# Patient Record
Sex: Male | Born: 1970 | Hispanic: No | Marital: Married | Smoking: Former smoker
Health system: Southern US, Community
[De-identification: ages and names within clinical notes are randomized; demographics above are authoritative.]

## PROBLEM LIST (undated history)

## (undated) DIAGNOSIS — K219 Gastro-esophageal reflux disease without esophagitis: Secondary | ICD-10-CM

## (undated) DIAGNOSIS — B353 Tinea pedis: Secondary | ICD-10-CM

## (undated) DIAGNOSIS — B181 Chronic viral hepatitis B without delta-agent: Secondary | ICD-10-CM

---

## 2008-09-30 ENCOUNTER — Encounter: Admission: RE | Admit: 2008-09-30 | Discharge: 2008-09-30 | Payer: Self-pay | Admitting: Gastroenterology

## 2010-04-21 ENCOUNTER — Emergency Department (HOSPITAL_BASED_OUTPATIENT_CLINIC_OR_DEPARTMENT_OTHER): Admission: EM | Admit: 2010-04-21 | Discharge: 2010-04-21 | Payer: Self-pay | Admitting: Emergency Medicine

## 2010-08-29 ENCOUNTER — Encounter: Payer: Self-pay | Admitting: Gastroenterology

## 2010-08-30 ENCOUNTER — Encounter: Payer: Self-pay | Admitting: Gastroenterology

## 2011-09-06 ENCOUNTER — Other Ambulatory Visit: Payer: Self-pay

## 2011-09-06 ENCOUNTER — Observation Stay (HOSPITAL_BASED_OUTPATIENT_CLINIC_OR_DEPARTMENT_OTHER)
Admission: EM | Admit: 2011-09-06 | Discharge: 2011-09-07 | Disposition: A | Discharge status: Home / Self Care | Payer: 59 | Attending: Internal Medicine | Admitting: Internal Medicine

## 2011-09-06 ENCOUNTER — Emergency Department (INDEPENDENT_AMBULATORY_CARE_PROVIDER_SITE_OTHER): Payer: 59

## 2011-09-06 ENCOUNTER — Encounter (HOSPITAL_BASED_OUTPATIENT_CLINIC_OR_DEPARTMENT_OTHER): Payer: Self-pay | Admitting: *Deleted

## 2011-09-06 DIAGNOSIS — R0789 Other chest pain: Principal | ICD-10-CM | POA: Insufficient documentation

## 2011-09-06 DIAGNOSIS — K219 Gastro-esophageal reflux disease without esophagitis: Secondary | ICD-10-CM | POA: Insufficient documentation

## 2011-09-06 DIAGNOSIS — I214 Non-ST elevation (NSTEMI) myocardial infarction: Secondary | ICD-10-CM

## 2011-09-06 DIAGNOSIS — M79609 Pain in unspecified limb: Secondary | ICD-10-CM

## 2011-09-06 DIAGNOSIS — R079 Chest pain, unspecified: Secondary | ICD-10-CM

## 2011-09-06 HISTORY — DX: Tinea pedis: B35.3

## 2011-09-06 HISTORY — DX: Gastro-esophageal reflux disease without esophagitis: K21.9

## 2011-09-06 HISTORY — DX: Chronic viral hepatitis B without delta-agent: B18.1

## 2011-09-06 LAB — CBC
Hemoglobin: 15.1 g/dL (ref 13.0–17.0)
Platelets: 233 10*3/uL (ref 150–400)
RBC: 5.33 MIL/uL (ref 4.22–5.81)
WBC: 8.3 10*3/uL (ref 4.0–10.5)

## 2011-09-06 LAB — BASIC METABOLIC PANEL
CO2: 28 mEq/L (ref 19–32)
Chloride: 102 mEq/L (ref 96–112)
Creatinine, Ser: 0.8 mg/dL (ref 0.50–1.35)
GFR calc Af Amer: >90 mL/min (ref >90)
Sodium: 139 mEq/L (ref 135–145)

## 2011-09-06 LAB — TROPONIN I: Troponin I: 0.31 ng/mL (ref <0.30)

## 2011-09-06 LAB — CARDIAC PANEL(CRET KIN+CKTOT+MB+TROPI)
CK, MB: 2 ng/mL (ref 0.3–4.0)
Relative Index: 1.7 (ref 0.0–2.5)

## 2011-09-06 MED ORDER — SODIUM CHLORIDE 0.9 % IJ SOLN
3.0000 mL | Freq: Two times a day (BID) | INTRAMUSCULAR | Status: DC
  Administered 2011-09-06: 3 mL via INTRAVENOUS

## 2011-09-06 MED ORDER — ACETAMINOPHEN 325 MG PO TABS: 650.0000 mg | ORAL_TABLET | Freq: Four times a day (QID) | ORAL | Status: DC | PRN

## 2011-09-06 MED ORDER — SODIUM CHLORIDE 0.9 % IJ SOLN
3.0000 mL | Freq: Two times a day (BID) | INTRAMUSCULAR | Status: DC
  Administered 2011-09-06: 3 mL via INTRAVENOUS
  Filled 2011-09-06: qty 3

## 2011-09-06 MED ORDER — SODIUM CHLORIDE 0.9 % IV SOLN
250.0000 mL | INTRAVENOUS | Status: DC | PRN
  Administered 2011-09-06: 250 mL via INTRAVENOUS

## 2011-09-06 MED ORDER — ALBUTEROL SULFATE (5 MG/ML) 0.5% IN NEBU: 2.5000 mg | INHALATION_SOLUTION | RESPIRATORY_TRACT | Status: DC | PRN

## 2011-09-06 MED ORDER — ASPIRIN EC 325 MG PO TBEC
325.0000 mg | DELAYED_RELEASE_TABLET | Freq: Every day | ORAL | Status: DC
  Administered 2011-09-07: 325 mg via ORAL
  Filled 2011-09-06: qty 1

## 2011-09-06 MED ORDER — NITROGLYCERIN 0.4 MG SL SUBL: 0.4000 mg | SUBLINGUAL_TABLET | SUBLINGUAL | Status: DC | PRN

## 2011-09-06 MED ORDER — THERA M PLUS PO TABS
1.0000 | ORAL_TABLET | Freq: Every day | ORAL | Status: DC
  Administered 2011-09-06 – 2011-09-07 (×2): 1 via ORAL
  Filled 2011-09-06 (×2): qty 1

## 2011-09-06 MED ORDER — ACETAMINOPHEN 650 MG RE SUPP: 650.0000 mg | Freq: Four times a day (QID) | RECTAL | Status: DC | PRN

## 2011-09-06 MED ORDER — SODIUM CHLORIDE 0.9 % IJ SOLN
3.0000 mL | INTRAMUSCULAR | Status: DC | PRN
  Filled 2011-09-06: qty 3

## 2011-09-06 MED ORDER — ASPIRIN 81 MG PO CHEW
324.0000 mg | CHEWABLE_TABLET | Freq: Once | ORAL | Status: AC
  Administered 2011-09-06: 324 mg via ORAL
  Filled 2011-09-06: qty 4

## 2011-09-06 MED ORDER — PANTOPRAZOLE SODIUM 40 MG PO TBEC
40.0000 mg | DELAYED_RELEASE_TABLET | Freq: Every day | ORAL | Status: DC
  Administered 2011-09-06: 40 mg via ORAL
  Filled 2011-09-06: qty 1

## 2011-09-06 NOTE — H&P (Signed)
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 He was sitting and watching TV when he had 3/10, pinching type of precordial chest pain with no associated dyspnea, diaphoresis, radiation. This pain did not worsen with deep inspiration and was not related to food. It resolved spontaneously. In the last 4-5 days patient has been stressed by the death of his father. His father passed away 4 days ago from metastatic gallbladder cancer. After finishing all the last rites, patient was resting last night  and at approximately 9 PM experienced precordial 3/10 pressure type of chest pain/discomfort with intermittent radiation to his left back, left axilla and left medial arm up to the elbow. This was not associated with dyspnea, palpitations, diaphoresis, deep inspiration. This was unlike the pain of his usual reflux. This pain lasted all night and he was worried about it. He called his primary care physician this morning who advised him to come to the emergency department. He went to the [facility] emergency department where his troponin was minimally elevated at 0.31. He was given a full dose of aspirin and was transferred to the [facility] for further evaluation and management. Patient still has intermittent left-sided chest pain. Does no history of recent travel or asymmetrical leg swelling or pain. He gives no history of lifting heavy weights or direct trauma.   Past Medical Hist did notory: Past Medical History  Diagnosis Date  . GERD (gastroesophageal reflux disease)   . Hepatitis B carrier   . Athlete's foot     bilateral    Past Surgical History: History reviewed. No pertinent past surgical history.  Allergies:  No Known Allergies  Medications: Prior to Admission  medications   Medication Sig Start Date End Date Taking? Authorizing Provider  aspirin 81 MG tablet Take 160 mg by mouth daily.   Yes Historical Provider, MD  Multiple Vitamins-Minerals (MULTIVITAMINS THER. W/MINERALS) TABS Take 1 tablet by mouth daily.   Yes Historical Provider, MD  naproxen sodium (ANAPROX) 220 MG tablet Take 220 mg by mouth daily as needed. For pain   Yes Historical Provider, MD    Family History: Family History  Problem Relation Age of Onset  . Hypertension Mother   . Coronary artery disease Father   . Cancer Father     Social History:  reports that he has quit smoking. He has never used smokeless tobacco. He reports that he does not drink alcohol or use illicit drugs.  Review of Systems:  All systems reviewed and apart from history of presenting illness is negative.   Physical Exam: Filed Vitals:   09/06/11 1240 09/06/11 1350 09/06/11 1502 09/06/11 1603  BP: 126/78 122/68 128/76 128/91  Pulse: 79 79 82 50  Temp: 98 F (36.7 C) 98.5 F (36.9 C) 98.6 F (37 C) 97.7 F (36.5 C)  TempSrc: Oral Oral  Oral  Resp: 18 20 20 18   Height:    5\' 9"  (1.753 m)  Weight:    95.8 kg (211 lb 3.2 oz)  SpO2: 98% 97% 100% 94%   General exam: Well built and nourished male patient who is in no obvious distress. Head, eyes and ENT: Nontraumatic and normocephalic. Pupils equally reacting to light and accommodation. Oral mucosa is moist without any acute findings. Lymphatics: No  lymphadenopathy. Neck: Supple. No JVD or carotid bruit. Respiratory system: Clear. No increased work of breathing. No chest wall tenderness. Cardiovascular system: First and second heart sounds heard, regular. No JVD or carotid bruit. Telemetry shows sinus rhythm in the 80s. Gastrointestinal system: Abdomen is nondistended, soft and normal bowel sounds heard. No organomegaly or masses appreciated. Nontender. Central nervous system: Alert and oriented. No focal neurological deficits. Extremities: 5 x 5  symmetric power in all limbs and no asymmetric swelling. Patient has scars of healing athletes feet. Skin without any or rashes   Labs on Admission:   Basename 09/06/11 1120  NA 139  K 3.8  CL 102  CO2 28  GLUCOSE 95  BUN 17  CREATININE 0.80  CALCIUM 9.7  MG --  PHOS --   No results found for this basename: AST:2,ALT:2,ALKPHOS:2,BILITOT:2,PROT:2,ALBUMIN:2 in the last 72 hours No results found for this basename: LIPASE:2,AMYLASE:2 in the last 72 hours  Basename 09/06/11 1120  WBC 8.3  NEUTROABS --  HGB 15.1  HCT 43.0  MCV 80.7  PLT 233    Basename 09/06/11 1120  CKTOTAL --  CKMB --  CKMBINDEX --  TROPONINI 0.31*   No results found for this basename: TSH,T4TOTAL,FREET3,T3FREE,THYROIDAB in the last 72 hours No results found for this basename: VITAMINB12:2,FOLATE:2,FERRITIN:2,TIBC:2,IRON:2,RETICCTPCT:2 in the last 72 hours  Radiological Exams on Admission: Dg Chest 2 View  09/06/2011  CHEST - 2 VIEW  IMPRESSION: Normal chest.    EKG: Shows normal sinus rhythm at 84 beats per minute, normal axis, normal EKG.      Assessment/Plan 1. Chest pain: Recurrent chest pain x2 in the last 6 weeks. Mostly atypical but some typical features for angina. Risk factors for coronary artery disease include family history but at a much older age. EKG is normal. Unclear significance of single minimally elevated troponin without CK or CK total readings. Admit to telemetry for 23 hour observation. Cycle cardiac enzymes. Continue aspirin 325 mg and Protonix. When necessary sublingual nitroglycerin. [facility] has been consulted to see if patient needs any further ischemia evaluation such as a stress test. Requested 2-D echocardiogram. 2. History of gastroesophageal reflux disease: Proton pump inhibitors 3. History of hepatitis B carrier state 4. History of athletes feet     Giacomo Valone 09/06/2011, 5:19 PM

## 2011-09-06 NOTE — ED Provider Notes (Addendum)
History     CSN: [id number]  Arrival date & time 09/06/11  1112   First MD Initiated Contact with Patient 09/06/11 1120      No chief complaint on file.   (Consider location/radiation/quality/duration/timing/severity/associated sxs/prior treatment) Patient is a 41 y.o. male presenting with chest pain. The history is provided by the patient.  Chest Pain The chest pain began 12 - 24 hours ago. Chest pain occurs constantly. The chest pain is improving. Associated with: not associated with exertion, breathing, or any other factor. At its most intense, the pain is at 3/10. The pain is currently at 0/10. The severity of the pain is mild. The quality of the pain is described as aching and dull. The pain radiates to the upper back and left arm. Exacerbated by: No worsening factors, including exertion or deep respirations. Pertinent negatives for primary symptoms include no fever, no fatigue, no syncope, no shortness of breath, no cough, no wheezing, no palpitations, no abdominal pain, no nausea, no vomiting, no dizziness and no altered mental status.  Pertinent negatives for associated symptoms include no claudication, no diaphoresis, no lower extremity edema, no near-syncope, no numbness, no orthopnea, no paroxysmal nocturnal dyspnea and no weakness. He tried nothing for the symptoms. Risk factors include no known risk factors. Past medical history comments: Department hepatitis B, no other medical history including diabetes, hypertension, or hyperlipidemia, and the patient is a nonsmoker Family history comments: No early coronary artery disease in family     History reviewed. No pertinent past medical history.  History reviewed. No pertinent past surgical history.  History reviewed. No pertinent family history.  History  Substance Use Topics  . Smoking status: Former Games developer  . Smokeless tobacco: Not on file  . Alcohol Use: No      Review of Systems  Constitutional: Negative for fever,  chills, diaphoresis, activity change, appetite change and fatigue.  HENT: Negative for ear pain, congestion, sore throat, rhinorrhea, neck pain, postnasal drip and sinus pressure.   Eyes: Negative for visual disturbance.  Respiratory: Negative for cough, chest tightness, shortness of breath and wheezing.   Cardiovascular: Positive for chest pain. Negative for palpitations, orthopnea, claudication, leg swelling, syncope and near-syncope.  Gastrointestinal: Negative for nausea, vomiting, abdominal pain and diarrhea.  Genitourinary: Negative.   Musculoskeletal: Negative for myalgias and back pain.  Skin: Negative for color change, pallor and rash.  Neurological: Negative for dizziness, tremors, syncope, weakness, light-headedness, numbness and headaches.  Hematological: Does not bruise/bleed easily.  Psychiatric/Behavioral: Negative for confusion and altered mental status.    Allergies  Review of patient's allergies indicates no known allergies.  Home Medications  No current outpatient prescriptions on file.  BP 126/91  Pulse 78  Temp(Src) 98.6 F (37 C) (Oral)  Resp 18  SpO2 96%  Physical Exam  Nursing note and vitals reviewed. Constitutional: He is oriented to person, place, and time. He appears well-developed and well-nourished. No distress.  HENT:  Head: Normocephalic and atraumatic.  Mouth/Throat: Oropharynx is clear and moist.  Eyes: EOM are normal. Pupils are equal, round, and reactive to light.  Neck: Normal range of motion. Neck supple. No JVD present. No tracheal deviation present.  Cardiovascular: Normal rate, regular rhythm, S1 normal, S2 normal, normal heart sounds and intact distal pulses.   No extrasystoles are present. PMI is not displaced.  Exam reveals no gallop and no friction rub.   No murmur heard. Pulmonary/Chest: Effort normal and breath sounds normal. No accessory muscle usage or stridor. Not tachypneic.  No respiratory distress. He has no decreased breath  sounds. He has no wheezes. He has no rhonchi. He has no rales. He exhibits no tenderness, no bony tenderness, no crepitus and no retraction.  Abdominal: Soft. Bowel sounds are normal. He exhibits no distension and no mass. There is no tenderness. There is no rebound and no guarding.  Musculoskeletal: Normal range of motion. He exhibits no edema and no tenderness.  Neurological: He is alert and oriented to person, place, and time. No cranial nerve deficit. He exhibits normal muscle tone.  Skin: Skin is warm and dry. No rash noted. He is not diaphoretic. No erythema. No pallor.  Psychiatric: He has a normal mood and affect. His behavior is normal. Judgment and thought content normal.    ED Course  Procedures (including critical care time)  Date: 09/06/2011  Rate: 84  Rhythm: normal sinus rhythm  QRS Axis: normal  Intervals: normal  ST/T Wave abnormalities: normal  Conduction Disutrbances:none  Narrative Interpretation: Non-provocative EKG  Old EKG Reviewed: none available    Labs Reviewed  CBC  I-STAT, CHEM 8  I-STAT TROPONIN I   No results found.   No diagnosis found.  1:43 PM The patient is in no distress and reports no chest pain currently.  His troponin is slightly elevated and as such I will seek admission to the hospital for further evaluation and testing. The patient states his understanding of and agreement with the plan of care.  MDM  Musculoskeletal chest pain, costochondritis, GERD, Gastrointestinal Chest Pain, Pleuritic Chest Pain, Pneumonia, Pneumothorax, Pulmonary Embolism, Esophageal Spasm, Arrhythmia considered among other potential etiologies in the patient's differential diagnosis.  ACS or AMI are thought to be unlikely given the patient's lack of risk factors, normal ecg, normal physical examination, and his atypical features of chest pain.  These diagnoses will be investigated nonetheless.          Felisa Bonier, MD 09/06/11 1204  Felisa Bonier, MD 09/06/11 1344

## 2011-09-06 NOTE — ED Notes (Signed)
Via [facility]--[facility]--unassign cardiology--spoke with Brett Sharp

## 2011-09-06 NOTE — Consult Note (Addendum)
CARDIOLOGY CONSULT NOTE   Patient ID: Brett Sharp MRN: [id number] DOB/AGE: May 28, 1971 41 y.o.  Admit date: 09/06/2011  Primary Physician   Fredderick Severance, MD, MD Primary Cardiologist  none Reason for Consultation   Chest pain   Brett Sharp is a 41 y.o. male with no history of CAD.   He is admitted today through med center [facility] with chest pain for the past 24-30 hours.  The chest discomfort is described as a very slight pinching sensation. It tends to radiate out his left arm. It is rated as a 1-2 on a scale of 10. It is not severe. There is no pleuritic component. It does not get worse with change of position, eating, drinking, taking a deep breath, or exercise. He is fairly active and the pain has not increased with activity over the past day. He has not done any rigorous exercise to have caused this pain.  His initial troponin level at [facility] was his 0.31 with a normal being up to 0.30. Subsequent cardiac enzymes are negative.   Past Medical History  Diagnosis Date  . GERD (gastroesophageal reflux disease)   . Hepatitis B carrier   . Athlete's foot     bilateral     History reviewed. No pertinent past surgical history.  No Known Allergies  I have reviewed the patient's current medications. Prescriptions prior to admission  Medication Sig Dispense Refill  . aspirin 81 MG tablet Take 160 mg by mouth daily.      . Multiple Vitamins-Minerals (MULTIVITAMINS THER. W/MINERALS) TABS Take 1 tablet by mouth daily.      . naproxen sodium (ANAPROX) 220 MG tablet Take 220 mg by mouth daily as needed. For pain          No current facility-administered medications on file prior to encounter.   No current outpatient prescriptions on file prior to encounter.    History   Social History  . Marital Status: Married    Spouse Name: N/A    Number of Children: N/A  . Years of Education: N/A   He works in Conservation officer, historic buildings.   Social History Main Topics    . Smoking status: Former Games developer  . Smokeless tobacco: Never Used  . Alcohol Use: No  . Drug Use: No  . Sexually Active: Not Currently   Other Topics Concern  . Not on file   Social History Narrative  . No narrative on file     Family History  Problem Relation Age of Onset  . Hypertension Mother   . Coronary artery disease Father   . Cancer Father      ROS:  Full 14 point review of systems complete and found to be negative unless listed  above  Physical Exam: Blood pressure 128/91, pulse 50, temperature 97.7 F (36.5 C), temperature source Oral, resp. rate 18, height 5\' 9"  (1.753 m), weight 211 lb 3.2 oz (95.8 kg), SpO2 94.00%.   General: Well developed, well nourished, in no acute distress Head: Eyes PERRLA, No xanthomas.   Normocephalic and atraumatic, oropharynx without edema or exudate. Dentition Lungs: Clear bilaterally to auscultation  Heart: HRRR S1 S2, no rub/gallop, Heart irregular rate and rhythm with S1, S2  murmur. pulses are 2+ & equal all 4 extrem.   Neck: No carotid bruit. No lymphadenopathy.  JVD. Abdomen: Bowel sounds present, abdomen soft and non-tender without masses or hernias noted. Msk:  No spine or cva tenderness. Normal strength and tone for age, no  joint deformities or effusions. Extremities: No clubbing or cyanosis.  edema.  Neuro: Alert and oriented X 3. No focal deficits noted. Psych:  Good affect, responds appropriately Skin: No rashes or lesions noted.  Labs:   Lab Results  Component Value Date   WBC 8.3 09/06/2011   HGB 15.1 09/06/2011   HCT 43.0 09/06/2011   MCV 80.7 09/06/2011   PLT 233 09/06/2011   No results found for this basename: INR in the last 72 hours  Lab 09/06/11 1120  NA 139  K 3.8  CL 102  CO2 28  BUN 17  CREATININE 0.80  CALCIUM 9.7  PROT --  BILITOT --  ALKPHOS --  ALT --  AST --  GLUCOSE 95    Basename 09/06/11 1825 09/06/11 1120  CKTOTAL 118 --  CKMB 2.0 --  TROPONINI <0.30 0.31*   ECG:06-Sep-2011  11:21:27 [facility]-HPED ROUTINE RECORD Normal sinus rhythm, Rate 84 Normal ECG No previous ECGs available  Radiology:  Dg Chest 2 View  09/06/2011  *RADIOLOGY REPORT*  Clinical Data: Chest pain and left arm pain.  CHEST - 2 VIEW  Comparison: None.  Findings: The heart size and vascularity are normal and the lungs are clear.  No osseous abnormality.  IMPRESSION: Normal chest.  Original Report Authenticated By: Gwynn Burly, M.D.    ASSESSMENT AND PLAN:    1. Chest pain: The patient presents with what appears to be noncardiac chest pain. The symptoms are very atypical. He's had symptoms now for the past 30 hours and despite this his cardiac enzymes are negative. I'd do not think that the troponin level of 0.31 is of any significance-especially since subsequent troponin levels are negative. That level is certainly within the margin of error for a lab test.   His EKG is completely normal.  If the remaining cardiac enzymes are negative then he can be discharged to home. I've given him my business card and will call to arrange a stress test as an outpatient.  I do not think that he necessarily needs an echocardiogram at this point. I certainly would not delay his discharge from the hospital for an echocardiogram if his cardiac enzymes and EKG are negative.   The patient has non-ST segment elevation myocardial infarction listed in his problem list. If the cardiac enzymes remain negative then I certainly would deleted that from his problem list.  As of this point, I would not diagnose him with a non-ST segment elevation myocardial infarction.   Vesta Mixer, Montez Hageman., MD, [facility] 09/06/2011, 8:25 PM

## 2011-09-07 ENCOUNTER — Encounter (HOSPITAL_COMMUNITY): Payer: Self-pay | Admitting: Dietician

## 2011-09-07 ENCOUNTER — Other Ambulatory Visit: Payer: Self-pay

## 2011-09-07 LAB — CARDIAC PANEL(CRET KIN+CKTOT+MB+TROPI)
CK, MB: 1.7 ng/mL (ref 0.3–4.0)
Relative Index: 1.7 (ref 0.0–2.5)
Total CK: 103 U/L (ref 7–232)
Total CK: 103 U/L (ref 7–232)

## 2011-09-07 NOTE — Progress Notes (Signed)
Pt quit smoking over 12 months ago.

## 2011-09-07 NOTE — Progress Notes (Signed)
CARDIOLOGY CONSULT NOTE   Patient ID: Brett Sharp MRN: [id number] DOB/AGE: 43/11/1970 43 y.o.  Admit date: 09/06/2011  Primary Physician   Fredderick Severance, MD, MD Primary Cardiologist  none Reason for Consultation   Chest pain   Brett Sharp is a 41 y.o. male with no history of CAD.   He is admitted today through med center [facility] with chest pain for the past 24-30 hours.  The chest discomfort is described as a very slight pinching sensation. It tends to radiate out his left arm. It is rated as a 1-2 on a scale of 10. It is not severe. There is no pleuritic component. It does not get worse with change of position, eating, drinking, taking a deep breath, or exercise. He is fairly active and the pain has not increased with activity over the past day. He has not done any rigorous exercise to have caused this pain.  His initial troponin level at [facility] was his 0.31 with a normal being up to 0.30. Subsequent cardiac enzymes are negative.   Past Medical History  Diagnosis Date  . GERD (gastroesophageal reflux disease)   . Hepatitis B carrier   . Athlete's foot     bilateral     History reviewed. No pertinent past surgical history.  No Known Allergies  I have reviewed the patient's current medications. Prescriptions prior to admission  Medication Sig Dispense Refill  . aspirin 81 MG tablet Take 160 mg by mouth daily.      . Multiple Vitamins-Minerals (MULTIVITAMINS THER. W/MINERALS) TABS Take 1 tablet by mouth daily.      . naproxen sodium (ANAPROX) 220 MG tablet Take 220 mg by mouth daily as needed. For pain          No current facility-administered medications on file prior to encounter.   No current outpatient prescriptions on file prior to encounter.    History   Social History  . Marital Status: Married    Spouse Name: N/A    Number of Children: N/A  . Years of Education: N/A   He works in Conservation officer, historic buildings.   Social History Main Topics    . Smoking status: Former Games developer  . Smokeless tobacco: Never Used  . Alcohol Use: No  . Drug Use: No  . Sexually Active: Not Currently   Other Topics Concern  . Not on file   Social History Narrative  . No narrative on file     Family History  Problem Relation Age of Onset  . Hypertension Mother   . Coronary artery disease Father   . Cancer Father      ROS:  Full 14 point review of systems complete and found to be negative unless listed  above  Physical Exam: Blood pressure 106/77, pulse 77, temperature 97.7 F (36.5 C), temperature source Oral, resp. rate 18, height 5\' 9"  (1.753 m), weight 211 lb 3.2 oz (95.8 kg), SpO2 97.00%.   General: Well developed, well nourished, in no acute distress Head: Eyes PERRLA, No xanthomas.   Normocephalic and atraumatic, oropharynx without edema or exudate. Dentition Lungs: Clear bilaterally to auscultation  Heart: HRRR S1 S2, no rub/gallop, Heart irregular rate and rhythm with S1, S2  murmur. pulses are 2+ & equal all 4 extrem.   Neck: No carotid bruit. No lymphadenopathy.  JVD. Abdomen: Bowel sounds present, abdomen soft and non-tender without masses or hernias noted. Msk:  No spine or cva tenderness. Normal strength and tone for age, no  joint deformities or effusions. Extremities: No clubbing or cyanosis.  edema.  Neuro: Alert and oriented X 3. No focal deficits noted. Psych:  Good affect, responds appropriately Skin: No rashes or lesions noted.  Labs:   Lab Results  Component Value Date   WBC 8.3 09/06/2011   HGB 15.1 09/06/2011   HCT 43.0 09/06/2011   MCV 80.7 09/06/2011   PLT 233 09/06/2011   No results found for this basename: INR in the last 72 hours   Lab 09/06/11 1120  NA 139  K 3.8  CL 102  CO2 28  BUN 17  CREATININE 0.80  CALCIUM 9.7  PROT --  BILITOT --  ALKPHOS --  ALT --  AST --  GLUCOSE 95    Basename 09/07/11 0100 09/06/11 1825 09/06/11 1120  CKTOTAL 103 118 --  CKMB 1.7 2.0 --  TROPONINI <0.30 <0.30  0.31*   ECG:06-Sep-2011 11:21:27 [facility]-HPED ROUTINE RECORD Normal sinus rhythm, Rate 84 Normal ECG No previous ECGs available  Radiology:  Dg Chest 2 View  09/06/2011  *RADIOLOGY REPORT*  Clinical Data: Chest pain and left arm pain.  CHEST - 2 VIEW  Comparison: None.  Findings: The heart size and vascularity are normal and the lungs are clear.  No osseous abnormality.  IMPRESSION: Normal chest.  Original Report Authenticated By: Gwynn Burly, M.D.    ASSESSMENT AND PLAN:    1. Chest pain: The patient presents with what appears to be noncardiac chest pain. The symptoms are very atypical. He's had symptoms now for the past 30 hours and despite this his cardiac enzymes are negative. I'd do not think that the troponin level of 0.31 is of any significance-especially since subsequent troponin levels are negative. That level is certainly within the margin of error for a lab test.   His EKG is completely normal.  The patient is feeling better .  I will see him in the office for a treadmill in a week or so, - OK to go home   Vesta Mixer, Montez Hageman., MD, [facility] 09/07/2011, 9:03 AM

## 2011-09-07 NOTE — Progress Notes (Signed)
Utilization review complete 

## 2011-09-07 NOTE — Discharge Summary (Signed)
Discharge Summary  Brett Sharp MR#: [id number]  DOB:[birth year]  Date of Admission: 09/06/2011 Date of Discharge: 09/07/2011  Patient's PCP: Fredderick Severance, MD, MD  Attending Physician:Emani Morad  Consults: Cardiology: Leodis Sias, M.D  Discharge Diagnoses: 1. Atypical Chest pain 2. 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 Pain was 3/10 in severity, pinching in nature with some radiation to the left side of the back, left axilla and left upper arm. He did not have any other associated symptoms. He went to the [facility] emergency department in [place]. EKG was normal. Troponin x1 was 0.31. Patient was transferred to [facility] to rule out acute coronary syndrome.   Discharge Medications Current Discharge Medication List    CONTINUE these medications which have NOT CHANGED   Details  aspirin EC 81 MG tablet Take 81 mg by mouth daily.    Multiple Vitamins-Minerals (MULTIVITAMINS THER. W/MINERALS) TABS Take 1 tablet by mouth daily.    naproxen sodium (ANAPROX) 220 MG tablet Take 220 mg by mouth daily as needed. For pain        Hospital Course: 1. Chest pain: Resolved for greater than 12 hours. No acute changes on EKG. Cardiac enzymes cycled x3 and negative. Cardiology was consulted and Dr. Elease Hashimoto opined that this was most likely noncardiac chest pain and has cleared him for discharge to followup with him as an outpatient for a treadmill stress test in a week or so. Patient is to continue his daily aspirin 81 mg. 2. 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 Sharrell Ku. If his stress test is negative and patient continues to have chest pains then may consider evaluation for upper GI causes with an EGD. 3. GERD: Clinical diagnosis. Patient has never had an EGD. Most times he does not have any symptoms  and is not on any scheduled medications.     Day of Discharge BP 106/77  Pulse 77  Temp(Src) 97.7 F (36.5 C) (Oral)  Resp 18  Ht 5\' 9"  (1.753 m)  Wt 95.8 kg (211 lb 3.2 oz)  BMI 31.19 kg/m2  SpO2 97%  General exam: Comfortable Respiratory system: Clear. No increased work of breathing. Cardiovascular system: First and second heart sounds heard, regular. No JVD or murmurs. Telemetry shows sinus rhythm in 60s to 70s with occasional missed beat and PACs. Gastrointestinal system: Abdomen is nondistended, nontender, soft and normal bowel sounds heard. Central nervous system: Alert and oriented. No focal neurological deficits.   Results for orders placed during the hospital encounter of 09/06/11 (from the past 48 hour(s))  CBC     Status: Normal   Collection Time   09/06/11 11:20 AM      Component Value Range Comment   WBC 8.3  4.0 - 10.5 (K/uL)    RBC 5.33  4.22 - 5.81 (MIL/uL)    Hemoglobin 15.1  13.0 - 17.0 (g/dL)    HCT 40.9  81.1 - 91.4 (%)    MCV 80.7  78.0 - 100.0 (fL)    MCH 28.3  26.0 - 34.0 (pg)    MCHC 35.1  30.0 - 36.0 (g/dL)    RDW 78.2  95.6 - 21.3 (%)    Platelets 233  150 - 400 (K/uL)   TROPONIN I     Status: Abnormal   Collection Time   09/06/11 11:20 AM  Component Value Range Comment   Troponin I 0.31 (*) <0.30 (ng/mL)   BASIC METABOLIC PANEL     Status: Normal   Collection Time   09/06/11 11:20 AM      Component Value Range Comment   Sodium 139  135 - 145 (mEq/L)    Potassium 3.8  3.5 - 5.1 (mEq/L)    Chloride 102  96 - 112 (mEq/L)    CO2 28  19 - 32 (mEq/L)    Glucose, Bld 95  70 - 99 (mg/dL)    BUN 17  6 - 23 (mg/dL)    Creatinine, Ser 4.09  0.50 - 1.35 (mg/dL)    Calcium 9.7  8.4 - 10.5 (mg/dL)    GFR calc non Af Amer >90  >90 (mL/min)    GFR calc Af Amer >90  >90 (mL/min)   CARDIAC PANEL(CRET KIN+CKTOT+MB+TROPI)     Status: Normal   Collection Time   09/06/11  6:25 PM      Component Value Range Comment   Total CK 118  7 - 232 (U/L)    CK,  MB 2.0  0.3 - 4.0 (ng/mL)    Troponin I <0.30  <0.30 (ng/mL)    Relative Index 1.7  0.0 - 2.5    CARDIAC PANEL(CRET KIN+CKTOT+MB+TROPI)     Status: Normal   Collection Time   09/07/11  1:00 AM      Component Value Range Comment   Total CK 103  7 - 232 (U/L)    CK, MB 1.7  0.3 - 4.0 (ng/mL)    Troponin I <0.30  <0.30 (ng/mL)    Relative Index 1.7  0.0 - 2.5    CARDIAC PANEL(CRET KIN+CKTOT+MB+TROPI)     Status: Normal   Collection Time   09/07/11  8:50 AM      Component Value Range Comment   Total CK 103  7 - 232 (U/L)    CK, MB 1.8  0.3 - 4.0 (ng/mL)    Troponin I <0.30  <0.30 (ng/mL)    Relative Index 1.7  0.0 - 2.5      Dg Chest 2 View  09/06/2011  *RADIOLOGY REPORT*  Clinical Data: Chest pain and left arm pain.  CHEST - 2 VIEW  Comparison: None.  Findings: The heart size and vascularity are normal and the lungs are clear.  No osseous abnormality.  IMPRESSION: Normal chest.  Original Report Authenticated By: Gwynn Burly, M.D.   EKG: Normal sinus rhythm. Voltage criteria for left ventricular hypertrophy versus normal varient. Q waves in lead 3 and aVF. T-wave inversion in leads 3 and aVF question repolarization abnormality.   Disposition: Discharge home in stable condition.  Diet: Heart healthy diet.  Activity: As tolerated.   Follow-up Appts: Discharge Orders    Future Orders Please Complete By Expires   Diet - low sodium heart healthy      Activity as tolerated - No restrictions      Call MD for:  severe uncontrolled pain         TESTS THAT NEED FOLLOW-UP Outpatient stress test with the cardiologist.   Time spent on discharge, talking to the patient, and coordinating care: 30 mins.   SignedMarcellus Scott, MD 09/07/2011, 11:19 AM

## 2011-09-08 ENCOUNTER — Telehealth: Payer: Self-pay | Admitting: *Deleted

## 2011-09-08 NOTE — Telephone Encounter (Signed)
Informed pt to expect a call from office with date and time of stress treadmill test. DR NAHSER WANTS TO BE THERE FOR TEST.

## 2011-09-14 ENCOUNTER — Other Ambulatory Visit: Payer: Self-pay | Admitting: Gastroenterology

## 2011-09-14 DIAGNOSIS — B181 Chronic viral hepatitis B without delta-agent: Secondary | ICD-10-CM

## 2011-09-16 ENCOUNTER — Ambulatory Visit (INDEPENDENT_AMBULATORY_CARE_PROVIDER_SITE_OTHER): Payer: Self-pay | Admitting: Cardiovascular Disease

## 2011-09-16 DIAGNOSIS — R079 Chest pain, unspecified: Secondary | ICD-10-CM

## 2011-09-16 NOTE — Progress Notes (Signed)
Exercise Treadmill Test  Pre-Exercise Testing Evaluation Rhythm: normal sinus  Rate: 76   PR:  .14 QRS:  .08  QT:  .36 QTc: .40     Test  Exercise Tolerance Test Ordering MD: Kristeen Miss MD  Interpreting MD:  Kristeen Miss, MD  Unique Test No: Kristeen Miss MD  Treadmill:  1  Indication for ETT: chest pain - rule out ischemia  Contraindication to ETT: No   Stress Modality: exercise - treadmill  Cardiac Imaging Performed: non   Protocol: standard Bruce - maximal  Max BP:  170/79  Max MPHR (bpm):  178 85% MPR (bpm):  151  MPHR obtained (bpm):  176 % MPHR obtained:  98  Reached 85% MPHR (min:sec):  4:26 Total Exercise Time (min-sec):  9:00  Workload in METS:  10.4 Borg Scale: 19  Reason ETT Terminated:  patient's desire to stop    ST Segment Analysis At Rest: normal ST segments - no evidence of significant ST depression With Exercise: no evidence of significant ST depression  Other Information Arrhythmia:  No Angina during ETT:  absent (0) Quality of ETT:  diagnostic  ETT Interpretation:  normal - no evidence of ischemia by ST analysis Non cardiac chest pain Comments: Pt did well  Recommendations: Continue with normal exercise. Good exercise capacity.

## 2011-09-22 ENCOUNTER — Ambulatory Visit
Admission: RE | Admit: 2011-09-22 | Discharge: 2011-09-22 | Disposition: A | Discharge status: Home / Self Care | Payer: 59 | Source: Ambulatory Visit | Attending: Gastroenterology | Admitting: Gastroenterology

## 2011-09-22 DIAGNOSIS — B181 Chronic viral hepatitis B without delta-agent: Secondary | ICD-10-CM

## 2012-06-03 IMAGING — CR DG CHEST 2V
2 series · 2 of 2 positions shown · non-contrast
Comparison: None.

CLINICAL DATA: Chest pain and left arm pain.

CHEST - 2 VIEW

[w chest pa]
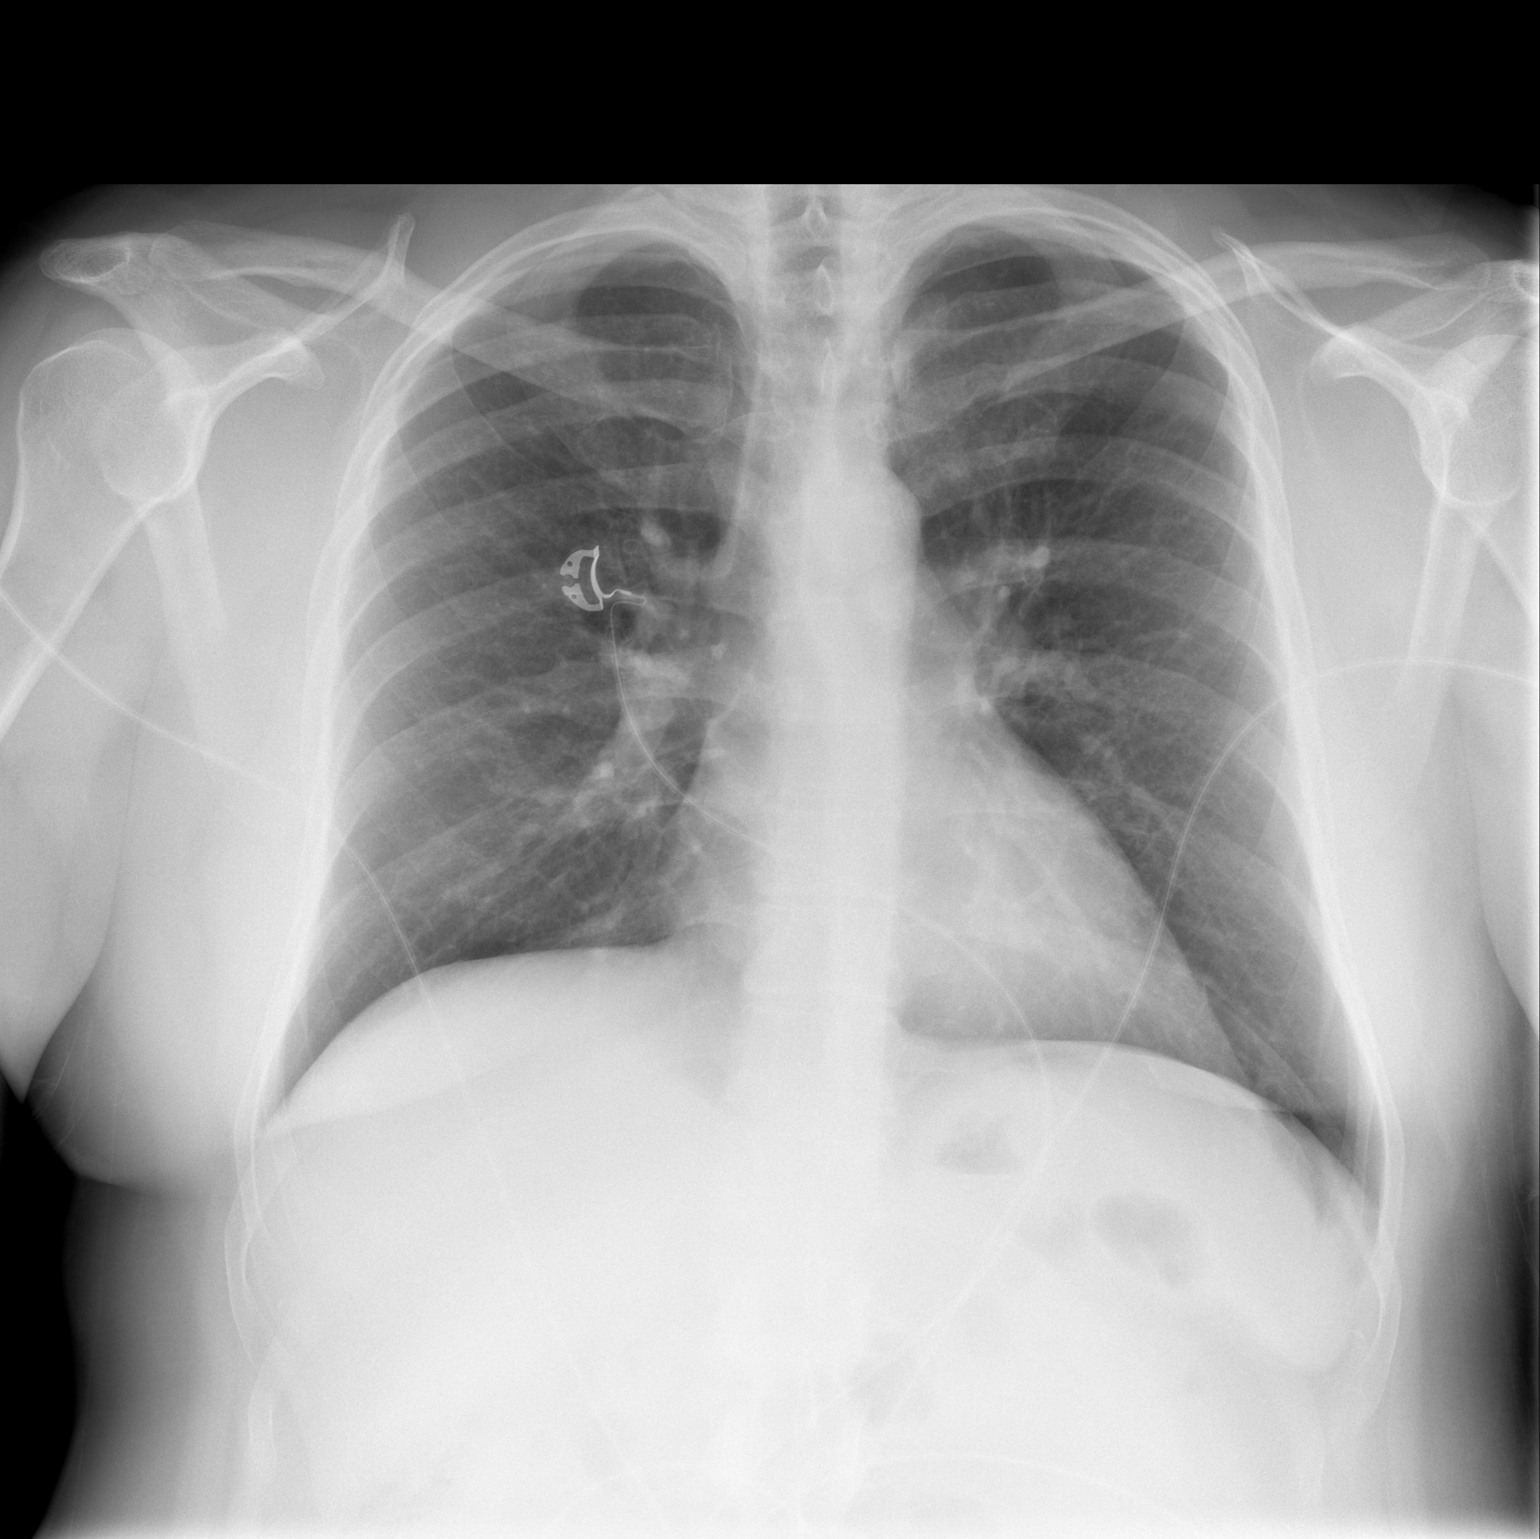

[w chest lat]
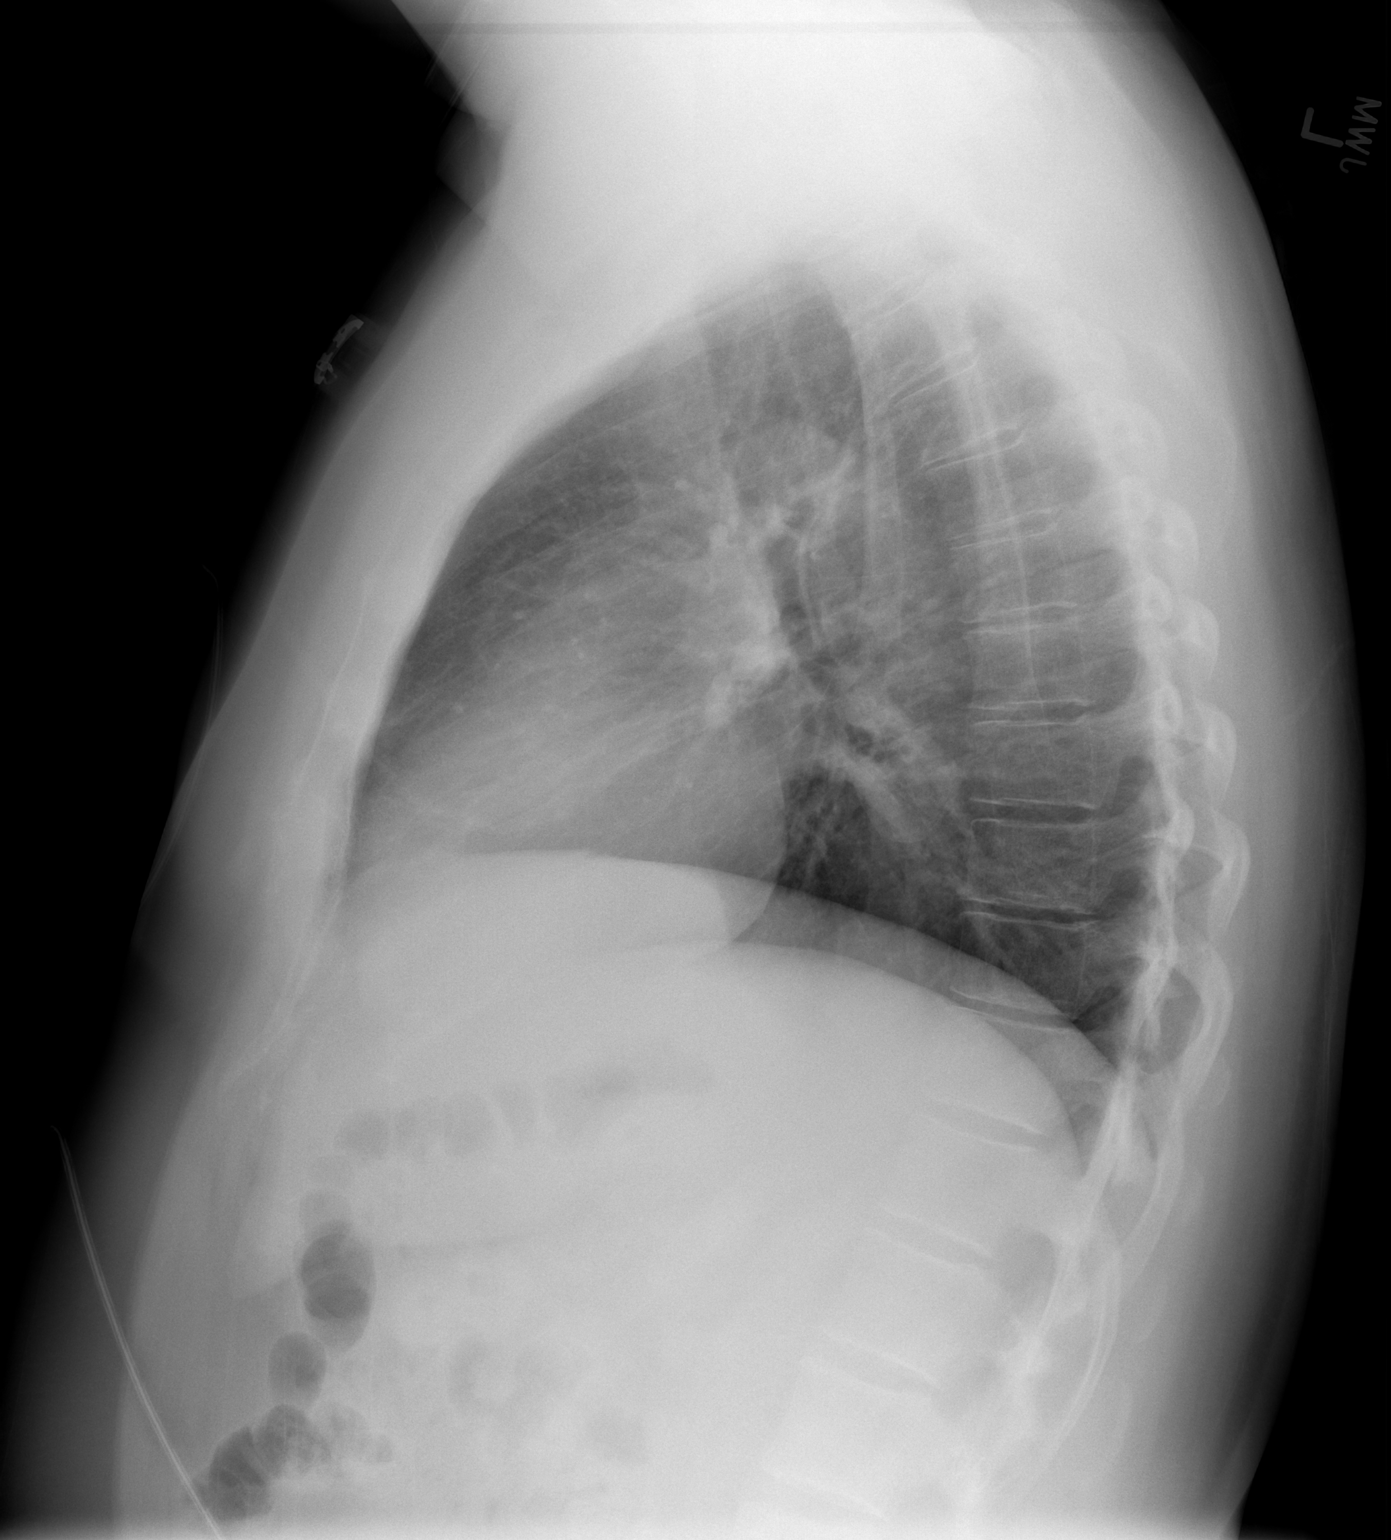

[2 of 2 positions shown; findings below may reference images not displayed]

FINDINGS: The heart size and vascularity are normal and the lungs
are clear.  No osseous abnormality.
IMPRESSION: Normal chest.

## 2012-06-19 IMAGING — US US ABDOMEN COMPLETE
1 series · 13 of 25 positions shown · non-contrast
Comparison: Abdominal ultrasound 09/30/2008 and 07/12/2007.

CLINICAL DATA: History of hepatitis B.

COMPLETE ABDOMINAL ULTRASOUND

[Series 1: us abdomen complete · 0.34mm/px · 13 of 84 slices shown]
[im 1/84]
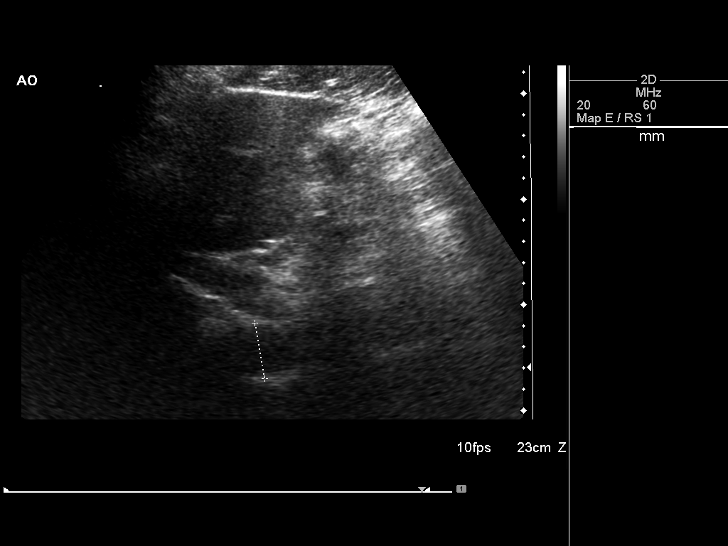
[im 7/84]
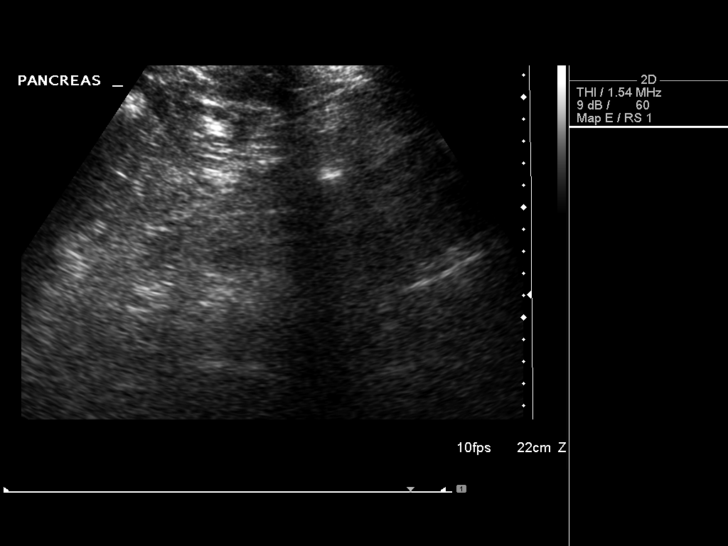
[im 14/84]
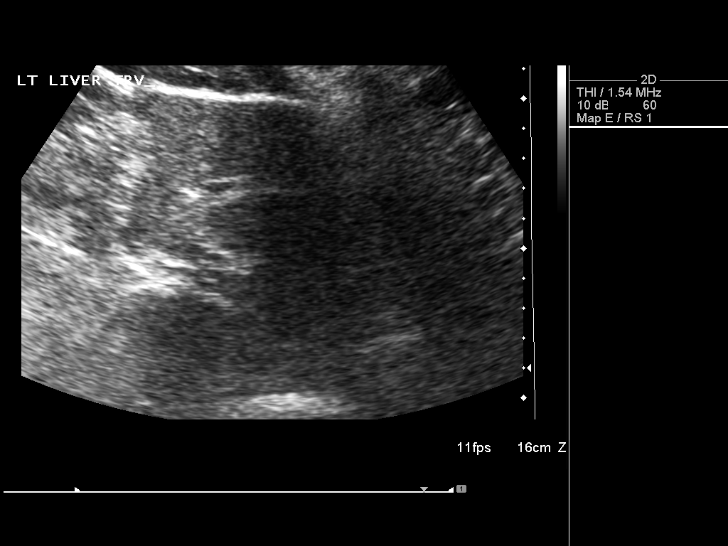
[im 21/84]
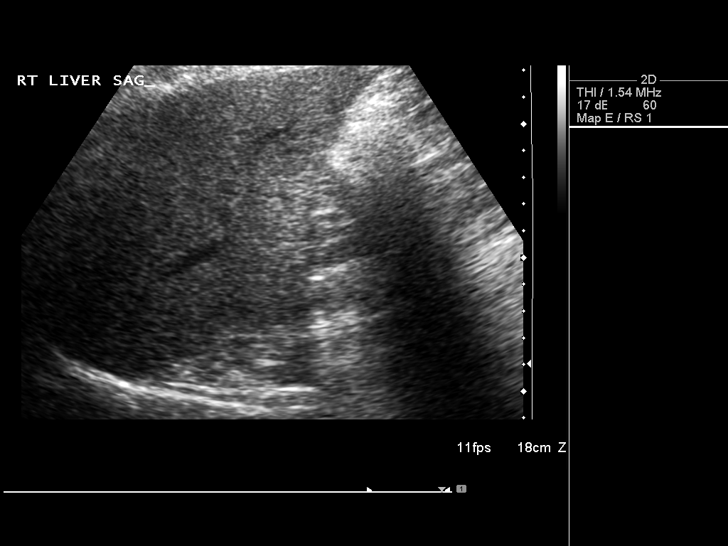
[im 28/84]
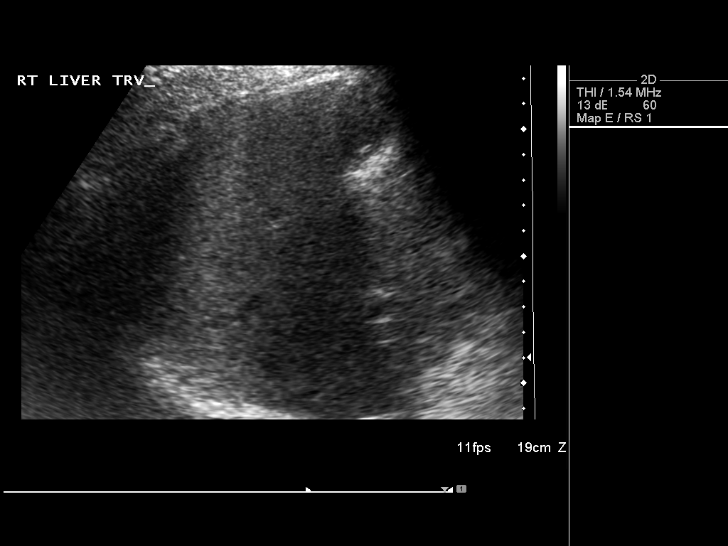
[im 35/84]
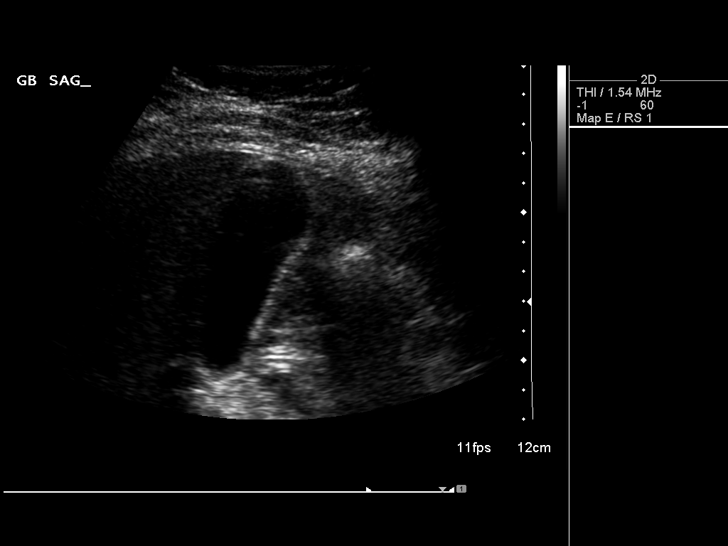
[im 42/84]
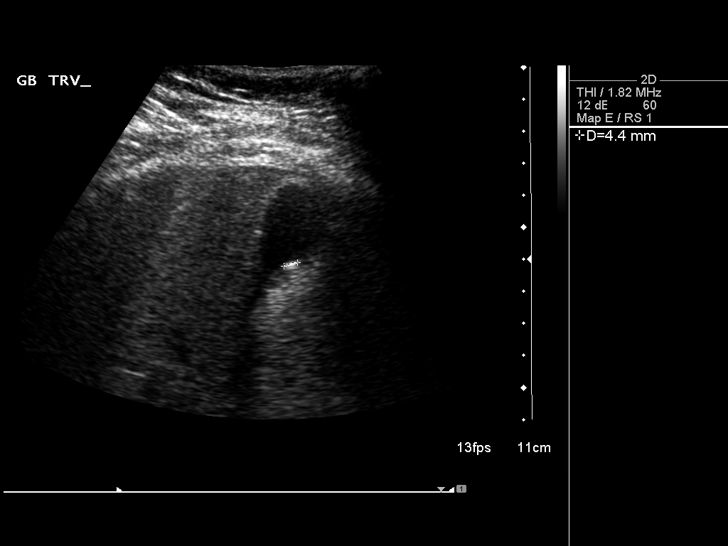
[im 49/84]
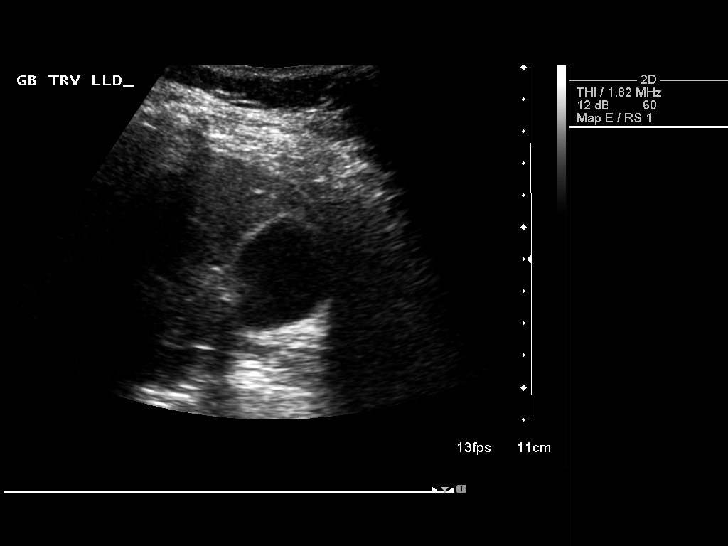
[im 56/84]
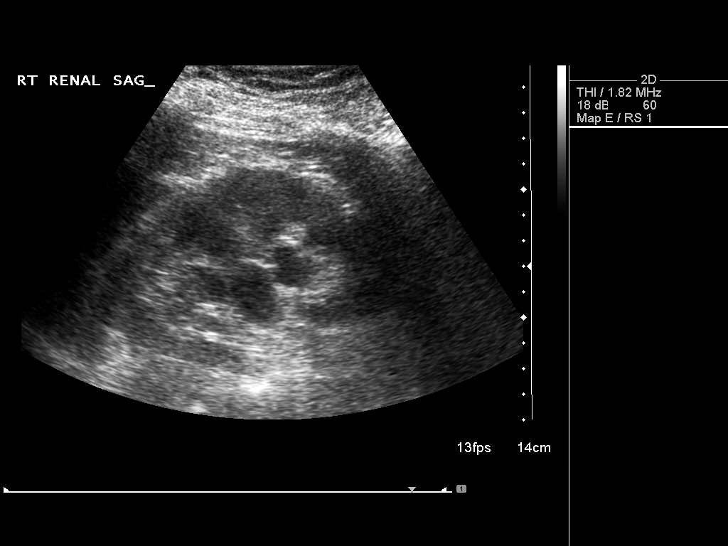
[im 63/84]
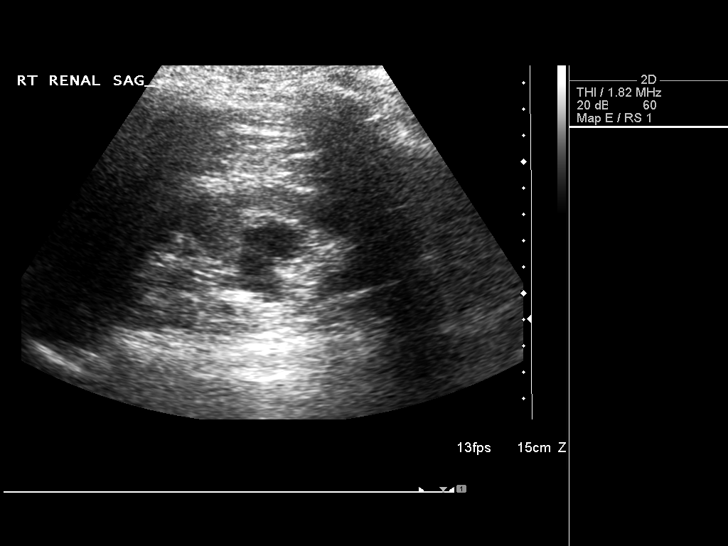
[im 70/84]
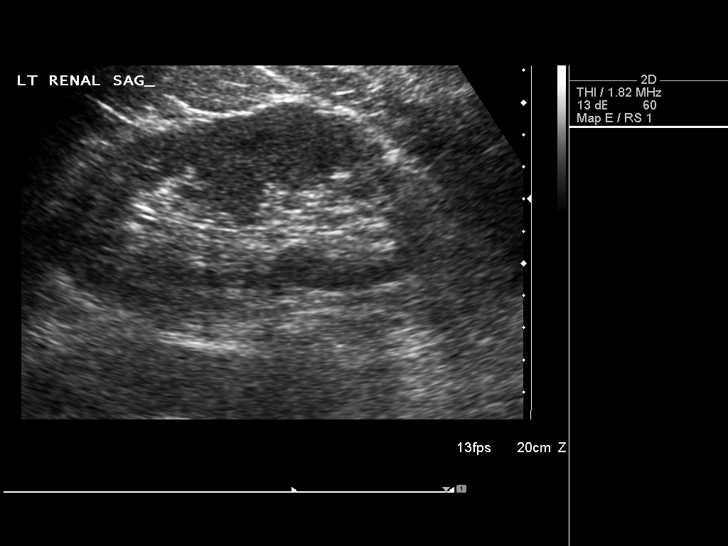
[im 77/84]
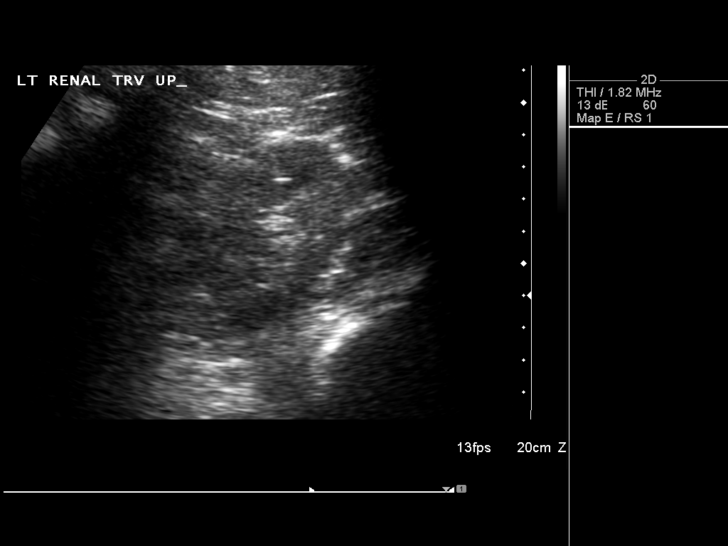
[im 84/84]
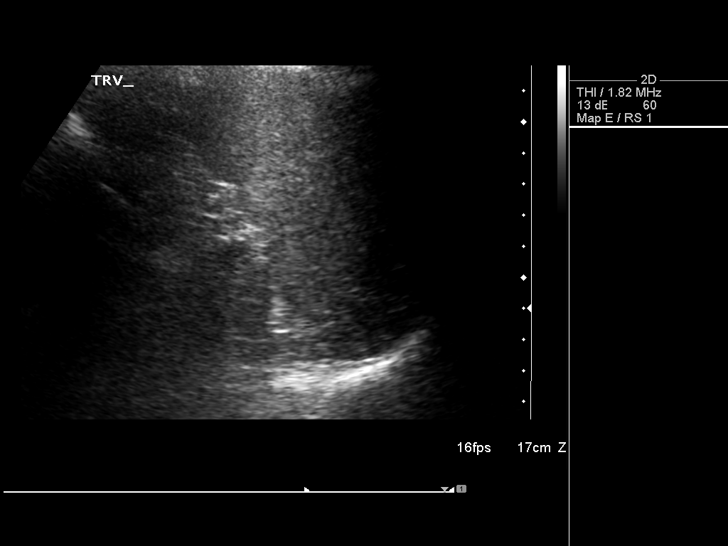

[13 of 25 positions shown; findings below may reference images not displayed]

FINDINGS: Gallbladder:  A single mobile stone measuring 0.4 cm is identified
within the gallbladder.  No gallbladder wall thickening or
pericholecystic fluid.  Sonographer reports negative Murphy's sign.

Common bile duct:  Measures 0.4 cm.

Liver:  No focal lesion or intrahepatic biliary ductal dilatation.
Increased echogenicity and coarsened echotexture noted.

IVC:  Appears normal.

Pancreas:  No focal abnormality seen.

Spleen:  Measures 6.2 cm and appears normal.

Right Kidney:  Measures 11.7 cm. There is new mild right
hydronephrosis.  Right renal cyst seen on the prior study is not
discretely visualized.  No stone is identified.

Left Kidney:  Measures 12.2 cm and appears normal.

Abdominal aorta:  No aneurysm identified.
IMPRESSION: 1.  New mild right hydronephrosis.  Cause for hydronephrosis is not
identified.  Recommend correlation with urinalysis and possibly CT
scan of the abdomen and pelvis with evaluation.
2.  Fatty infiltration of the liver.
3.  Single small mobile gallstone.  No evidence of cholecystitis.
4. These results will be called to the ordering clinician or
representative by the Radiologist Assistant, and communication
documented in the PACS Dashboard.

## 2014-02-07 ENCOUNTER — Other Ambulatory Visit: Payer: Self-pay | Admitting: Gastroenterology

## 2014-02-07 DIAGNOSIS — C220 Liver cell carcinoma: Secondary | ICD-10-CM

## 2014-02-11 ENCOUNTER — Ambulatory Visit
Admission: RE | Admit: 2014-02-11 | Discharge: 2014-02-11 | Disposition: A | Discharge status: Home / Self Care | Payer: 59 | Source: Ambulatory Visit | Attending: Gastroenterology | Admitting: Gastroenterology

## 2014-02-11 ENCOUNTER — Encounter (INDEPENDENT_AMBULATORY_CARE_PROVIDER_SITE_OTHER): Payer: Self-pay

## 2014-02-11 DIAGNOSIS — C220 Liver cell carcinoma: Secondary | ICD-10-CM

## 2014-11-09 IMAGING — US US ABDOMEN COMPLETE
1 series · 13 of 25 positions shown · non-contrast
Comparison: 10/11/2011 CT abdomen/ pelvis at [REDACTED],
abdominal ultrasound 09/22/2011

CLINICAL DATA: Hepatitis B, hepatoma surveillance

EXAM:
ULTRASOUND ABDOMEN COMPLETE

[Series 1: us abdomen complete · 0.35mm/px · 13 of 85 slices shown]
[im 1/85]
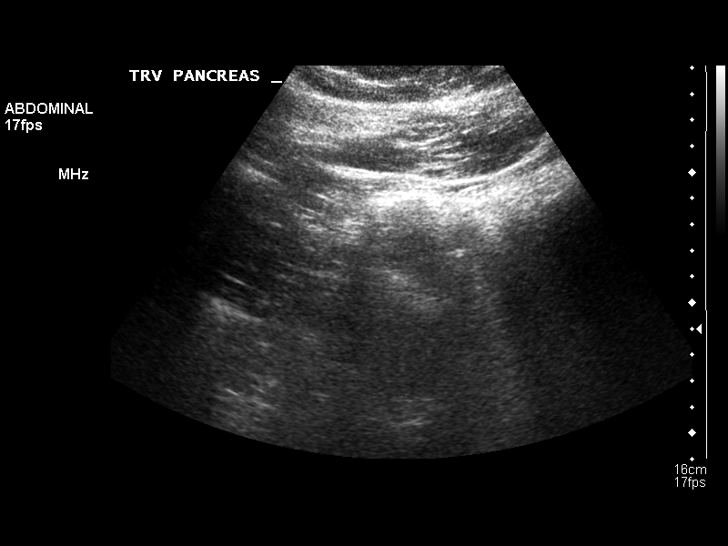
[im 8/85]
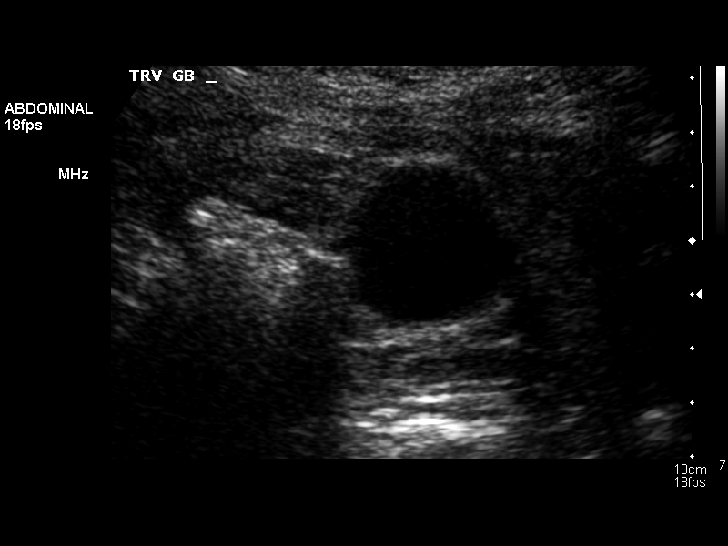
[im 15/85]
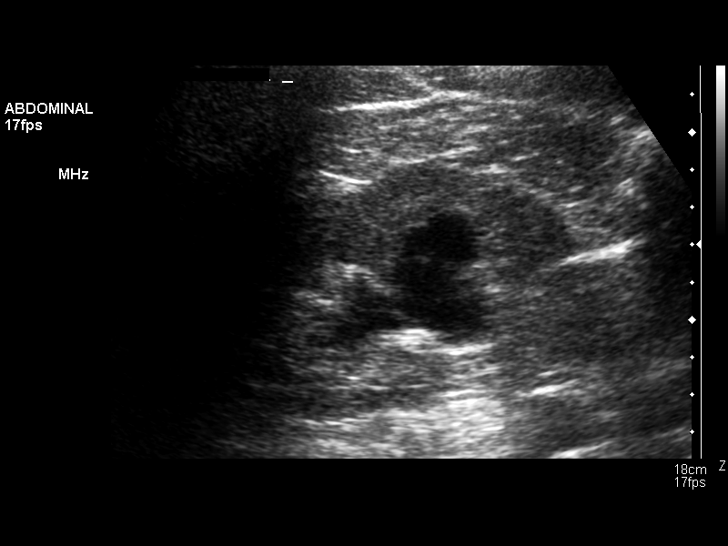
[im 22/85]
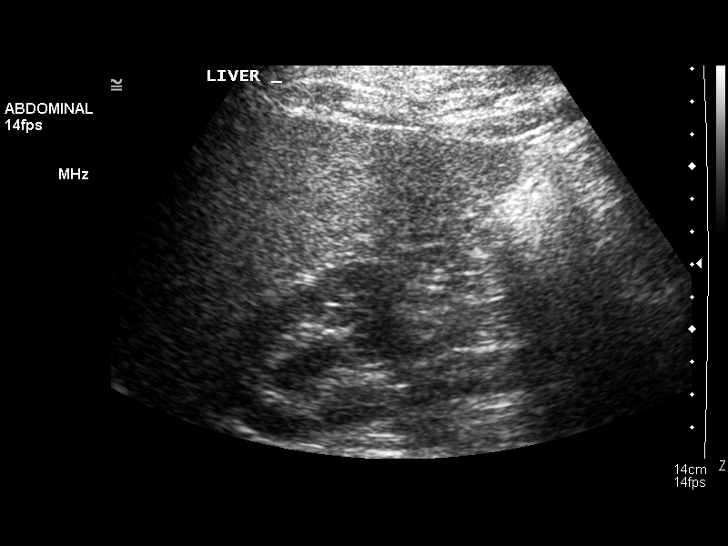
[im 29/85]
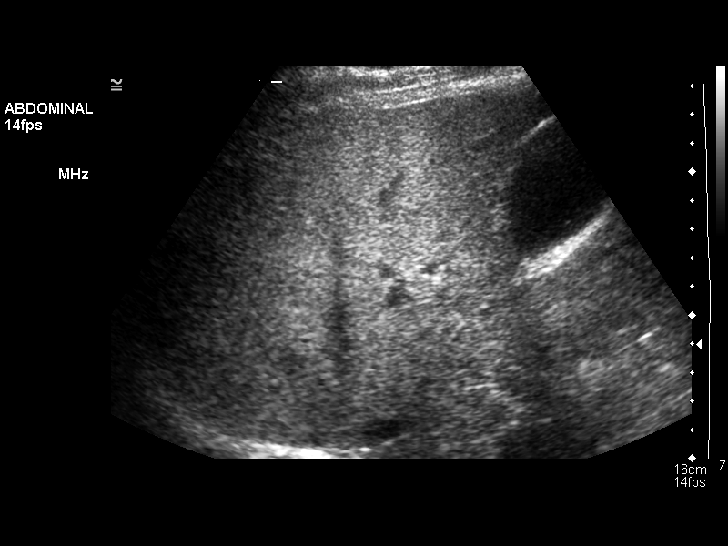
[im 36/85]
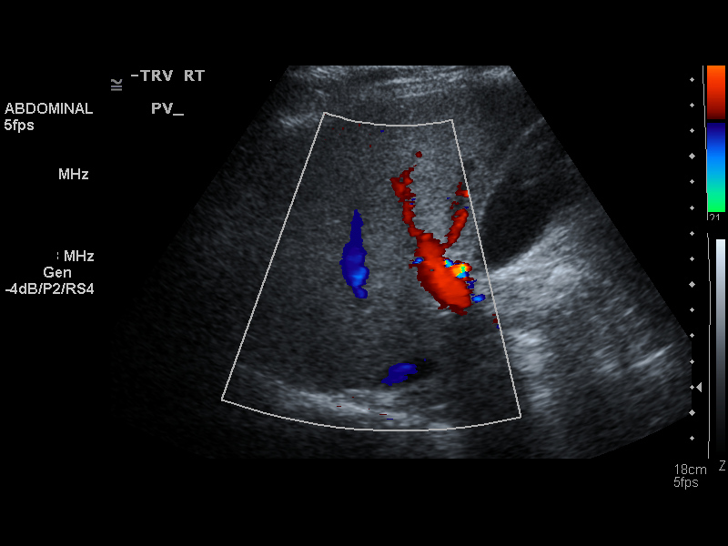
[im 43/85]
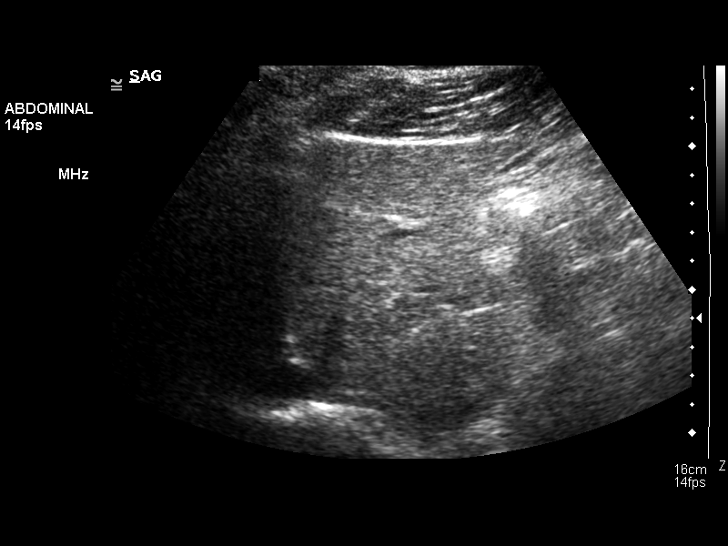
[im 50/85]
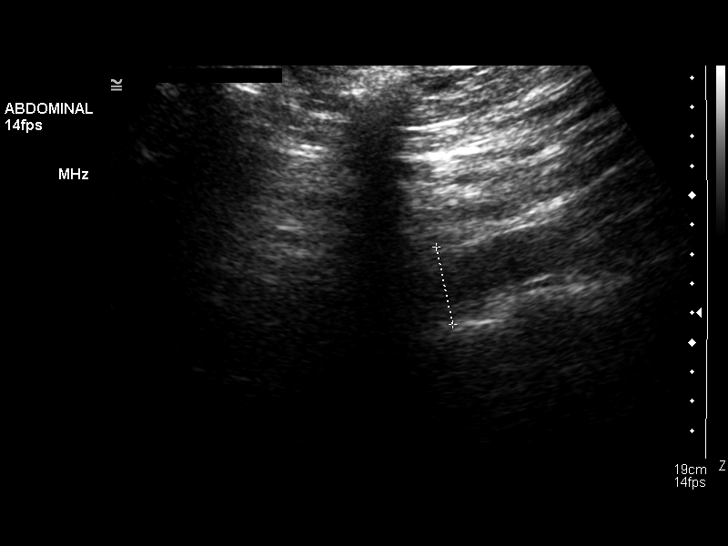
[im 57/85]
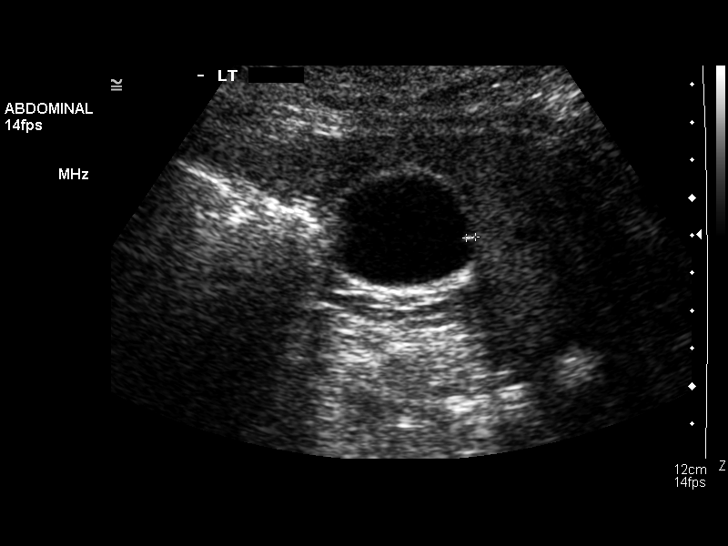
[im 64/85]
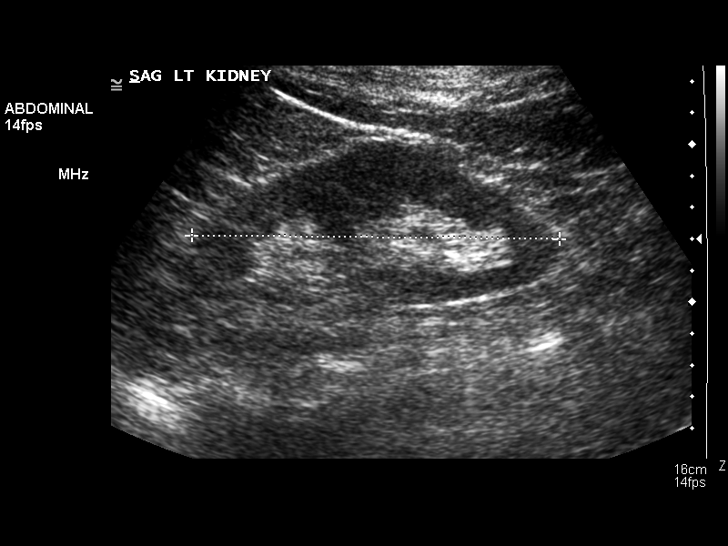
[im 71/85]
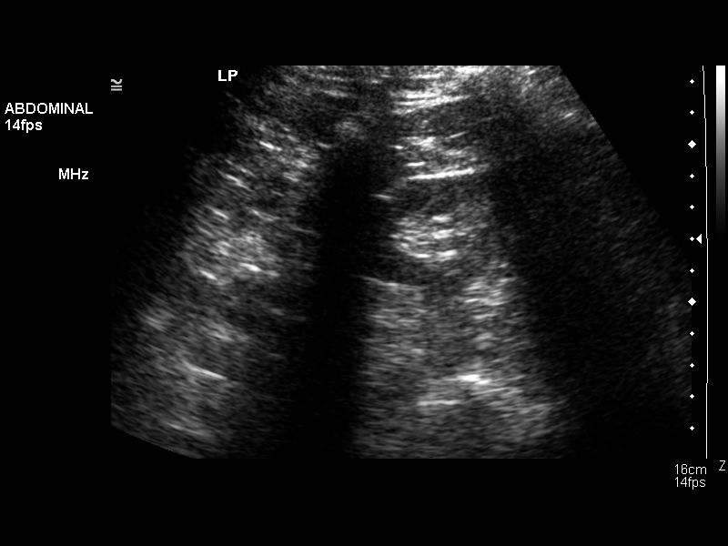
[im 78/85]
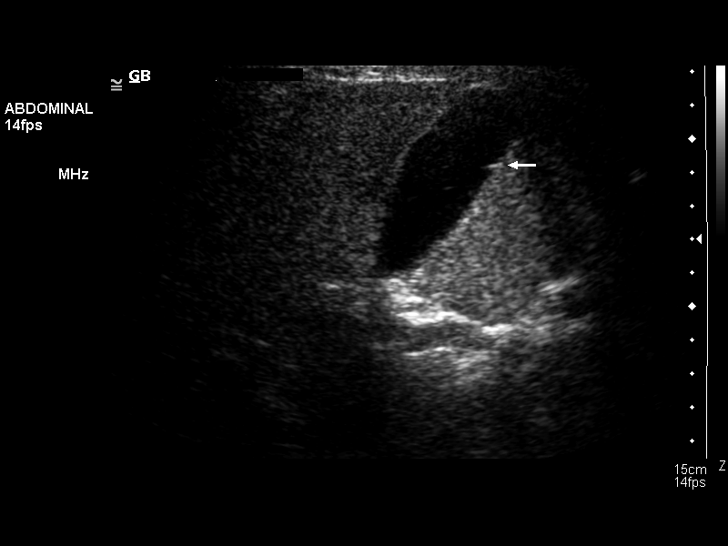
[im 85/85]
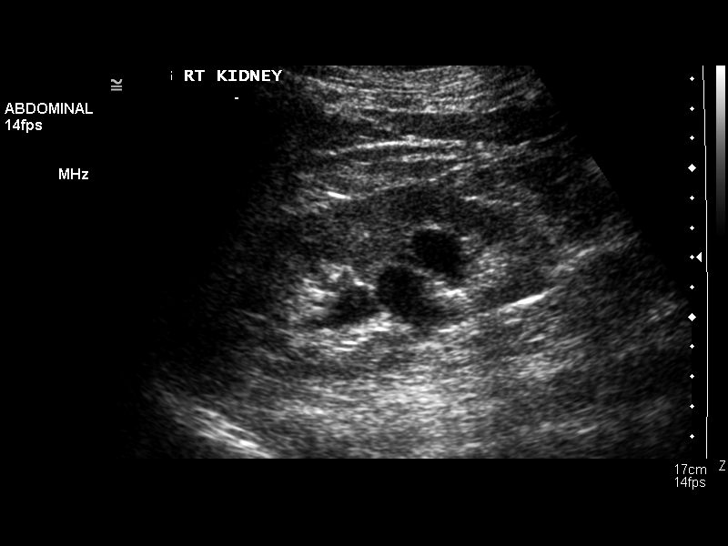

[13 of 25 positions shown; findings below may reference images not displayed]

FINDINGS: Gallbladder:

2 mm adherent stone/sludge ball or polyp again noted. No mobile
gallstone, gallbladder wall thickening, pericholecystic fluid, or
sonographic Murphy sign.

Common bile duct:

Diameter: 4 mm

Liver:

Inhomogeneously echogenic without focal abnormality or intrahepatic
ductal dilatation.

IVC:

Suboptimally visualized due to increased hepatic echogenicity.

Pancreas:

Obscured by bowel gas

Spleen:

Size and appearance within normal limits.

Right Kidney:

Length: 12.4 cm. Compared to the prior exam and dissimilar exam
10/11/2011, there is a probable right mid renal parapelvic cyst,
stable, and mild to moderate upper pole collecting system
dilatation, slightly increased since previously. This could reflect
local mass effect by the parapelvic cyst. Renal cortical thickness
is maintained.

Left Kidney:

Length: 11.9 cm. Echogenicity within normal limits. No mass or
hydronephrosis visualized.

Abdominal aorta:

Partly obscured by bowel gas.  No aneurysm identified.

Other findings:

None.
IMPRESSION: Inhomogeneously echogenic liver which could reflect steatosis but
other infiltrative processes including diffuse hepatoma could appear
similar. This specifically decreases the sensitivity and specificity
for detection of hepatoma. MRI abdomen with contrast is suggested as
the optimal imaging examination of choice for hepatoma surveillance.

Slight progression of mild to moderate right upper pole
hydronephrosis, likely due in part to mass effect by a stable right
mid kidney parapelvic cyst.
